# Patient Record
Sex: Male | Born: 1995 | Marital: Single
Health system: Southern US, Community
[De-identification: ages and names within clinical notes are randomized; demographics above are authoritative.]

## PROBLEM LIST (undated history)

## (undated) HISTORY — PX: TONSILLECTOMY: SUR1361

---
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
## 2020-04-23 ENCOUNTER — Ambulatory Visit
Admission: EM | Admit: 2020-04-23 | Discharge: 2020-04-23 | Disposition: A | Discharge status: Home / Self Care | Payer: Self-pay | Attending: Emergency Medicine | Admitting: Emergency Medicine

## 2020-04-23 ENCOUNTER — Other Ambulatory Visit: Payer: Self-pay

## 2020-04-23 ENCOUNTER — Encounter: Payer: Self-pay | Admitting: Emergency Medicine

## 2020-04-23 DIAGNOSIS — K029 Dental caries, unspecified: Secondary | ICD-10-CM

## 2020-04-23 DIAGNOSIS — K047 Periapical abscess without sinus: Secondary | ICD-10-CM

## 2020-04-23 MED ORDER — CHLORHEXIDINE GLUCONATE 0.12 % MT SOLN: OROMUCOSAL | 0 refills | Status: DC

## 2020-04-23 MED ORDER — IBUPROFEN 600 MG PO TABS: 600.0000 mg | ORAL_TABLET | Freq: Four times a day (QID) | ORAL | 0 refills | Status: DC | PRN

## 2020-04-23 MED ORDER — AMOXICILLIN-POT CLAVULANATE 875-125 MG PO TABS: 1.0000 | ORAL_TABLET | Freq: Two times a day (BID) | ORAL | 0 refills | Status: AC

## 2020-04-23 NOTE — Discharge Instructions (Signed)
Frequent salt water rinses, Peridex or Listerine after eating, ibuprofen 600 mg combined with 1000 mg of Tylenol 3-4 times together as needed for pain.  Finish the Augmentin, even if you feel better. OPTIONS FOR DENTAL FOLLOW UP CARE  [place] [facility] - Local Safety Net Dental Clinics TripDoors.com.htm   [facility] [phone])  [facility] [phone])  [place] [phone])  [facility] [phone])  [facility] [phone]) This clinic caters to the indigent population and is on a lottery system. Location: [employer] [facility], [employer], [address], [place] Clinic Hours: Wednesdays from 6pm - 9pm, patients seen by a lottery system. For dates, call or go to [email or web address] Services: Cleanings, fillings and simple extractions. Payment Options: DENTAL WORK IS FREE OF CHARGE. Bring proof of income or support. Best way to get seen: Arrive at 5:15 pm - this is a lottery, NOT first come/first serve, so arriving earlier will not increase your chances of being seen.     [facility] [phone] Select option 1 for emergencies   Location: [facility], [place], [address], [place] Clinic Hours: No walk-ins accepted - call the day before to schedule an appointment. Check in times are 9:30 am and 1:30 pm. Services: Simple extractions, temporary fillings, pulpectomy/pulp debridement, uncomplicated abscess drainage. Payment Options: PAYMENT IS DUE AT THE TIME OF SERVICE.  Fee is usually $100-200, additional surgical procedures (e.g. abscess drainage) may be extra. Cash, checks, Visa/MasterCard accepted.  Can file Medicaid if patient is covered for dental - patient should call case worker to check. No discount for [facility]  patients. Best way to get seen: MUST call the day before and get onto the schedule. Can usually be seen the next 1-2 days. No walk-ins accepted.     [facility] [phone]   Location: [facility], [address] [place] Clinic Hours: M, W, Th, F 8am or 1:30pm, Tues 9a or 1:30 - first come/first served. Services: Simple extractions, temporary fillings, uncomplicated abscess drainage.  You do not need to be an [facility] resident. Payment Options: PAYMENT IS DUE AT THE TIME OF SERVICE. Dental insurance, otherwise sliding scale - bring proof of income or support. Depending on income and treatment needed, cost is usually $50-200. Best way to get seen: Arrive early as it is first come/first served.     [facility] [phone]   Location: [address] Clinic Hours: Mon-Thu 8a-5p Services: Most basic dental services including extractions and fillings. Payment Options: PAYMENT IS DUE AT THE TIME OF SERVICE. Sliding scale, up to 50% off - bring proof if income or support. Medicaid with dental option accepted. Best way to get seen: Call to schedule an appointment, can usually be seen within 2 weeks OR they will try to see walk-ins - show up at 8a or 2p (you may have to wait).     [facility] [phone] [place] RESIDENTS ONLY   Location: [facility], [address], [place], [place] [address] Clinic Hours: By appointment only. Monday - Thursday 8am-5pm, Friday 8am-12pm Services: Cleanings, fillings, extractions. Payment Options: PAYMENT IS DUE AT THE TIME OF SERVICE. Cash, Visa or MasterCard. Sliding scale - $30 minimum per service. Best way to get seen: Come in to office, complete packet and make an appointment - need proof of income or support monies  for each household member and proof of [facility] residence. Usually takes about a month to  get in.     [facility] [phone]   Location: [address] [facility] Clinic Hours: Walk-in Urgent Care Dental Services are offered Monday-Friday mornings only. The numbers of emergencies accepted daily is limited to the number of providers available. Maximum 15 - Mondays, Wednesdays & Thursdays Maximum 10 - Tuesdays & Fridays Services: You do not need to be a [facility] resident to be seen for a dental emergency. Emergencies are defined as pain, swelling, abnormal bleeding, or dental trauma. Walkins will receive x-rays if needed. NOTE: Dental cleaning is not an emergency. Payment Options: PAYMENT IS DUE AT THE TIME OF SERVICE. Minimum co-pay is $40.00 for uninsured patients. Minimum co-pay is $3.00 for Medicaid with dental coverage. Dental Insurance is accepted and must be presented at time of visit. Medicare does not cover dental. Forms of payment: Cash, credit card, checks. Best way to get seen: If not previously registered with the clinic, walk-in dental registration begins at 7:15 am and is on a first come/first serve basis. If previously registered with the clinic, call to make an appointment.     [facility] [phone] [place] RESIDENTS ONLY   Location: [address], [place], [place] Clinic Hours: Mon-Thu 10a-2p Services: Extractions only! Payment Options: FREE (donations accepted) - bring proof of income or support Best way to get seen: Call and schedule an appointment OR come at 8am on the 1st Monday of every month (except for holidays) when it is first come/first served.     [facility] [phone]   Location: [address] [place], [place] Clinic Hours: Friday mornings Services, Payment Options, Best way to get seen: Call for info Here is a list of primary care providers who are taking new patients:  Dr. Elizabeth Sauer [address] [place] [address] [phone]  [facility] Primary  Care at [facility] [address] [place], [place] [address] [phone]  [facility] [place] [address]  [place] [address]  [phone]  [facility] [address] [place], [place] [address] [phone]  [facility] [address] [place]  [phone] [place], [place] [address]  Here are clinics/ other resources who will see you if you do not have insurance. Some have certain criteria that you must meet. Call them and find out what they are:  [facility]: [address], [place], [place] [address] Phone: [phone] Hours: First and Third Saturdays of each Month, 9 a.m. - 1 p.m.  [facility]: [address] [place], [place] [address] Phone: [phone] Hours: Tuesday, 4 p.m. - 8 p.m. Thursday, 1 p.m. - 8 p.m. Wednesday, 9 a.m. - [facility] [address], [place], [place] [address] Phone: [phone] Pharmacy Phone Number: [phone] Dental Phone Number: [phone] [facility] Insurance Help: [phone]  Dental Hours: Monday - Thursday, 8 a.m. - 6 p.m.  [facility] [address], [place], [place] [address] Phone: [phone] Pharmacy Phone Number: [phone] [facility] Insurance Help: [phone]  [facility] [address] [place] [place], [place] [address] Phone: [phone] Pharmacy Phone Number: [phone] [facility] Insurance Help: [phone]  [facility] [address] [place], [place] [address] Phone: [phone] [facility] Insurance Help: [phone]   [facility] [address], [place], [place] [address] Phone: [phone]  Go to www.goodrx.com to look up  your medications. This will give you a list of where you can find your prescriptions at the most affordable prices. Or ask the pharmacist what the cash price is, or if they have any other discount programs available to help make your medication more affordable. This  can be less expensive than what you would pay with insurance.

## 2020-04-23 NOTE — ED Triage Notes (Signed)
Patient c/o right sided upper back tooth and gum pain for the past 2 months.  Patient denies fevers.

## 2020-04-23 NOTE — ED Provider Notes (Signed)
HPI  SUBJECTIVE:  Daniel Phillips is a 24 y.o. male who presents with 2 months of posterior right upper tooth ache.  He states that his gums are irritated, bleeding.  He states that the pain has gotten worse over the past several weeks.  He states that he has a right-sided headache with radiation of this pain into his ear.  He describes the pain as throbbing, sore, constant.  He reports sensitivity to temperature and air.  No fevers, trismus, facial swelling, dental trauma.  States that he has not seen a dentist in a "long time".  He has tried Orajel which no longer works, occasional 200 mg of ibuprofen and an unknown pain pill from his mother.  The ibuprofen, pain pill helps.  Symptoms are worse at night, with eating and drinking.  He is a smoker.  No history of diabetes.  PMD: None.  Dentist: None.    History reviewed. No pertinent past medical history.  Past Surgical History:  Procedure Laterality Date  . TONSILLECTOMY      Family History  Problem Relation Age of Onset  . Heart disease Mother   . Heart attack Father     Social History   Tobacco Use  . Smoking status: Current Every Day Smoker    Types: Cigarettes  . Smokeless tobacco: Never Used  Vaping Use  . Vaping Use: Former  Substance Use Topics  . Alcohol use: Not Currently  . Drug use: Not Currently    Types: Marijuana    No current facility-administered medications for this encounter.  Current Outpatient Medications:  .  amoxicillin-clavulanate (AUGMENTIN) 875-125 MG tablet, Take 1 tablet by mouth 2 (two) times daily for 7 days., Disp: 14 tablet, Rfl: 0 .  chlorhexidine (PERIDEX) 0.12 % solution, 15 mL swish and spit bid, Disp: 480 mL, Rfl: 0 .  ibuprofen (ADVIL) 600 MG tablet, Take 1 tablet (600 mg total) by mouth every 6 (six) hours as needed., Disp: 30 tablet, Rfl: 0  No Known Allergies   ROS  As noted in HPI.   Physical Exam  BP 106/82 (BP Location: Right Arm)   Pulse (!) 58   Temp 98.6 F (37 C)  (Oral)   Resp 16   Ht 5\' 9"  (1.753 m)   Wt 71.7 kg   SpO2 100%   BMI 23.33 kg/m   Constitutional: Well developed, well nourished, no acute distress Eyes:  EOMI, conjunctiva normal bilaterally HENT: Normocephalic, atraumatic,mucus membranes moist Tooth #1 posterior right upper molar decayed to the gum.  Positive tender, swollen gum.  No expressible purulent drainage. Tooth #22, posterior right lower molar extensively decayed.  Positive gingival tenderness.  No gingival swelling. No Expressible purulent drainage. No facial swelling, trismus.  No swelling under the tongue or jaw. Neck: No cervical lymphadenopathy Respiratory: Normal inspiratory effort Cardiovascular: Normal rate GI: nondistended skin: No rash, skin intact Musculoskeletal: no deformities Neurologic: Alert & oriented x 3, no focal neuro deficits Psychiatric: Speech and behavior appropriate   ED Course   Medications - No data to display  No orders of the defined types were placed in this encounter.   No results found for this or any previous visit (from the past 24 hour(s)). No results found.  ED Clinical Impression  1. Dental caries   2. Dental infection      ED Assessment/Plan  Patient with dental decay, and dental infection.  Salt water rinses, Peridex or Listerine, ibuprofen 600 mg combined with 1000 mg of Tylenol 3-4  times together as needed for pain.  Augmentin, dental list, primary care list.  Meds ordered this encounter  Medications  . amoxicillin-clavulanate (AUGMENTIN) 875-125 MG tablet    Sig: Take 1 tablet by mouth 2 (two) times daily for 7 days.    Dispense:  14 tablet    Refill:  0  . ibuprofen (ADVIL) 600 MG tablet    Sig: Take 1 tablet (600 mg total) by mouth every 6 (six) hours as needed.    Dispense:  30 tablet    Refill:  0  . chlorhexidine (PERIDEX) 0.12 % solution    Sig: 15 mL swish and spit bid    Dispense:  480 mL    Refill:  0    *This clinic note was created using  Scientist, clinical (histocompatibility and immunogenetics). Therefore, there may be occasional mistakes despite careful proofreading.   ?    Domenick Gong, MD 04/24/20 [phone]

## 2020-07-13 ENCOUNTER — Ambulatory Visit
Admission: EM | Admit: 2020-07-13 | Discharge: 2020-07-13 | Disposition: A | Discharge status: Home / Self Care | Payer: Medicaid Other | Attending: Internal Medicine | Admitting: Internal Medicine

## 2020-07-13 ENCOUNTER — Encounter: Payer: Self-pay | Admitting: Emergency Medicine

## 2020-07-13 ENCOUNTER — Other Ambulatory Visit: Payer: Self-pay

## 2020-07-13 DIAGNOSIS — K0889 Other specified disorders of teeth and supporting structures: Secondary | ICD-10-CM

## 2020-07-13 MED ORDER — IBUPROFEN 800 MG PO TABS: 800.0000 mg | ORAL_TABLET | Freq: Three times a day (TID) | ORAL | 0 refills | Status: AC | PRN

## 2020-07-13 MED ORDER — CHLORHEXIDINE GLUCONATE 0.12 % MT SOLN: OROMUCOSAL | 0 refills | Status: AC

## 2020-07-13 MED ORDER — PENICILLIN V POTASSIUM 500 MG PO TABS: 500.0000 mg | ORAL_TABLET | Freq: Four times a day (QID) | ORAL | 0 refills | Status: AC

## 2020-07-13 NOTE — ED Provider Notes (Signed)
[facility]    CSN: [id number] Arrival date & time: 07/13/20  0807      History   Chief Complaint Chief Complaint  Patient presents with  . Dental Pain    HPI Daniel Phillips is a 24 y.o. male who presents with L lower molar pain off and on x 1 month, but got worse 2 days ago. Has never seen a dentist since last episode in September this year. Denies fever, chills or sweats or swollen glands.     History reviewed. No pertinent past medical history.  There are no problems to display for this patient.   Past Surgical History:  Procedure Laterality Date  . TONSILLECTOMY         Home Medications    Prior to Admission medications   Medication Sig Start Date End Date Taking? Authorizing Provider  chlorhexidine (PERIDEX) 0.12 % solution 15 mL swish and spit bid 04/23/20   Domenick Gong, MD  ibuprofen (ADVIL) 600 MG tablet Take 1 tablet (600 mg total) by mouth every 6 (six) hours as needed. 04/23/20   Domenick Gong, MD    Family History Family History  Problem Relation Age of Onset  . Heart disease Mother   . Heart attack Father     Social History Social History   Tobacco Use  . Smoking status: Former Smoker    Types: Cigarettes    Quit date: 06/13/2020    Years since quitting: 0.0  . Smokeless tobacco: Never Used  Vaping Use  . Vaping Use: Former  Substance Use Topics  . Alcohol use: Not Currently  . Drug use: Not Currently    Types: Marijuana     Allergies   Patient has no known allergies.   Review of Systems Review of Systems + for L lower molar pain, negative for fever, chills, swollen glands, cough, rhinitis, sore thriat  Physical Exam Triage Vital Signs ED Triage Vitals  Enc Vitals Group     BP 07/13/20 0816 118/71     Pulse Rate 07/13/20 0816 60     Resp 07/13/20 0816 18     Temp 07/13/20 0816 98.3 F (36.8 C)     Temp Source 07/13/20 0816 Oral     SpO2 07/13/20 0816 100 %     Weight 07/13/20 0814 158 lb 1.1 oz (71.7  kg)     Height 07/13/20 0814 5\' 9"  (1.753 m)     Head Circumference --      Peak Flow --      Pain Score 07/13/20 0814 8     Pain Loc --      Pain Edu? --      Excl. in GC? --    No data found.  Updated Vital Signs BP 118/71 (BP Location: Left Arm)   Pulse 60   Temp 98.3 F (36.8 C) (Oral)   Resp 18   Ht 5\' 9"  (1.753 m)   Wt 158 lb 1.1 oz (71.7 kg)   SpO2 100%   BMI 23.34 kg/m   Visual Acuity Right Eye Distance:   Left Eye Distance:   Bilateral Distance:    Right Eye Near:   Left Eye Near:    Bilateral Near:     Physical Exam Vitals and nursing note reviewed.  Constitutional:      General: He is not in acute distress.    Appearance: He is normal weight. He is not toxic-appearing.  HENT:     Head: Normocephalic.  Right Ear: External ear normal.     Left Ear: External ear normal.     Mouth/Throat:     Mouth: Mucous membranes are moist.     Comments: Has poor dentition, has cracked L lower last molar with mild redness of his gum. No abscess noted.  Eyes:     General: No scleral icterus.    Conjunctiva/sclera: Conjunctivae normal.  Neck:     Comments: Small shotty nodes noted on L anterior chain Pulmonary:     Effort: Pulmonary effort is normal.  Musculoskeletal:        General: Normal range of motion.     Cervical back: Neck supple.  Lymphadenopathy:     Cervical: Cervical adenopathy present.  Skin:    General: Skin is warm and dry.     Findings: No rash.  Neurological:     Mental Status: He is alert and oriented to person, place, and time.     Gait: Gait normal.  Psychiatric:        Mood and Affect: Mood normal.        Behavior: Behavior normal.        Thought Content: Thought content normal.        Judgment: Judgment normal.    UC Treatments / Results  Labs (all labs ordered are listed, but only abnormal results are displayed) Labs Reviewed - No data to display  EKG   Radiology No results found.  Procedures Procedures (including  critical care time)  Medications Ordered in UC Medications - No data to display  Initial Impression / Assessment and Plan / UC Course  I have reviewed the triage vital signs and the nursing notes. Carious teeth with cracked molar which is causing increased pain and may have a secondary infection. I placed him on [facility] as noted and refilled the mouth rinse  he was given  Last time which he states helped him a lot. I also placed him on Ibuprofen 800 mg tid prn pain.   Final Clinical Impressions(s) / UC Diagnoses   Final diagnoses:  None   Discharge Instructions   None    ED Prescriptions    None     PDMP not reviewed this encounter.   Garey Ham, PA-C 07/13/20 1019

## 2020-07-13 NOTE — ED Triage Notes (Signed)
Pt c/o lower left sided tooth pain. Started about a month ago but has gotten worse over the last couple of days. He states he has swelling in the gun area.

## 2020-07-13 NOTE — Discharge Instructions (Signed)
You may add Tylenol 1000 mg between the Ibuprofen doses for pain.

## 2022-04-25 ENCOUNTER — Ambulatory Visit
Admission: EM | Admit: 2022-04-25 | Discharge: 2022-04-25 | Disposition: A | Discharge status: Home / Self Care | Payer: 59 | Attending: Family Medicine | Admitting: Family Medicine

## 2022-04-25 ENCOUNTER — Ambulatory Visit (INDEPENDENT_AMBULATORY_CARE_PROVIDER_SITE_OTHER): Payer: Self-pay

## 2022-04-25 DIAGNOSIS — R319 Hematuria, unspecified: Secondary | ICD-10-CM

## 2022-04-25 DIAGNOSIS — U071 COVID-19: Secondary | ICD-10-CM

## 2022-04-25 DIAGNOSIS — M545 Low back pain, unspecified: Secondary | ICD-10-CM

## 2022-04-25 DIAGNOSIS — R509 Fever, unspecified: Secondary | ICD-10-CM

## 2022-04-25 DIAGNOSIS — R109 Unspecified abdominal pain: Secondary | ICD-10-CM

## 2022-04-25 LAB — CBC WITH DIFFERENTIAL/PLATELET
Abs Immature Granulocytes: 0.03 10*3/uL (ref 0.00–0.07)
Basophils Absolute: 0 10*3/uL (ref 0.0–0.1)
Basophils Relative: 0 %
Eosinophils Absolute: 0 10*3/uL (ref 0.0–0.5)
Eosinophils Relative: 0 %
HCT: 45.1 % (ref 39.0–52.0)
Hemoglobin: 15.3 g/dL (ref 13.0–17.0)
Immature Granulocytes: 0 %
Lymphocytes Relative: 20 %
Lymphs Abs: 2.1 10*3/uL (ref 0.7–4.0)
MCH: 31.4 pg (ref 26.0–34.0)
MCHC: 33.9 g/dL (ref 30.0–36.0)
MCV: 92.4 fL (ref 80.0–100.0)
Monocytes Absolute: 1.5 10*3/uL — ABNORMAL HIGH (ref 0.1–1.0)
Monocytes Relative: 14 %
Neutro Abs: 6.8 10*3/uL (ref 1.7–7.7)
Neutrophils Relative %: 66 %
Platelets: 203 10*3/uL (ref 150–400)
RBC: 4.88 MIL/uL (ref 4.22–5.81)
RDW: 12.2 % (ref 11.5–15.5)
WBC: 10.5 10*3/uL (ref 4.0–10.5)
nRBC: 0 % (ref 0.0–0.2)

## 2022-04-25 LAB — URINALYSIS, ROUTINE W REFLEX MICROSCOPIC
Bilirubin Urine: NEGATIVE
Glucose, UA: NEGATIVE mg/dL
Ketones, ur: NEGATIVE mg/dL
Leukocytes,Ua: NEGATIVE
Nitrite: NEGATIVE
Protein, ur: NEGATIVE mg/dL
Specific Gravity, Urine: 1.015 (ref 1.005–1.030)
pH: 7 (ref 5.0–8.0)

## 2022-04-25 LAB — COMPREHENSIVE METABOLIC PANEL
ALT: 23 U/L (ref 0–44)
AST: 22 U/L (ref 15–41)
Albumin: 4.5 g/dL (ref 3.5–5.0)
Alkaline Phosphatase: 58 U/L (ref 38–126)
Anion gap: 8 (ref 5–15)
BUN: 11 mg/dL (ref 6–20)
CO2: 29 mmol/L (ref 22–32)
Calcium: 9.3 mg/dL (ref 8.9–10.3)
Chloride: 102 mmol/L (ref 98–111)
Creatinine, Ser: 1 mg/dL (ref 0.61–1.24)
GFR, Estimated: >60 mL/min (ref >60)
Glucose, Bld: 95 mg/dL (ref 70–99)
Potassium: 4.3 mmol/L (ref 3.5–5.1)
Sodium: 139 mmol/L (ref 135–145)
Total Bilirubin: 0.4 mg/dL (ref 0.3–1.2)
Total Protein: 7.8 g/dL (ref 6.5–8.1)

## 2022-04-25 LAB — URINALYSIS, MICROSCOPIC (REFLEX)

## 2022-04-25 LAB — SARS CORONAVIRUS 2 BY RT PCR: SARS Coronavirus 2 by RT PCR: POSITIVE — AB

## 2022-04-25 MED ORDER — CYCLOBENZAPRINE HCL 7.5 MG PO TABS: 7.5000 mg | ORAL_TABLET | Freq: Two times a day (BID) | ORAL | 0 refills | Status: AC | PRN

## 2022-04-25 MED ORDER — NAPROXEN 500 MG PO TABS: 500.0000 mg | ORAL_TABLET | Freq: Two times a day (BID) | ORAL | 0 refills | Status: AC

## 2022-04-25 MED ORDER — PROMETHAZINE HCL 25 MG/ML IJ SOLN
25.0000 mg | Freq: Once | INTRAMUSCULAR | Status: AC
  Administered 2022-04-25: 25 mg via INTRAMUSCULAR

## 2022-04-25 MED ORDER — KETOROLAC TROMETHAMINE 60 MG/2ML IM SOLN
30.0000 mg | Freq: Once | INTRAMUSCULAR | Status: AC
  Administered 2022-04-25: 30 mg via INTRAMUSCULAR

## 2022-04-25 NOTE — Discharge Instructions (Addendum)
Your test for COVID-19 was positive, meaning that you were infected with the novel coronavirus and could give the germ to others.  Please continue isolation at home for at least 5 days since the start of your symptoms. Once you complete your 5 day quarantine, you may return to normal activities as long as you've not had a fever for over 24 hours(without taking fever reducing medicine) and your symptoms are improving. Be sure to wear a mask until Day 11.   Please continue good preventive care measures, including:  frequent hand-washing, avoid touching your face, cover coughs/sneezes, stay out of crowds and keep a 6 foot distance from others.  Go to the nearest hospital emergency room if fever/cough/breathlessness are severe or illness seems like a threat to life.  

## 2022-04-25 NOTE — ED Provider Notes (Signed)
[facility]    CSN: [id number] Arrival date & time: 04/25/22  1038      History   Chief Complaint Chief Complaint  Patient presents with   Fever   Back Pain   Emesis    HPI Daniel Phillips is a 26 y.o. male.   HPI   Daniel Phillips presents for myalgias and fatigue that started 2 days ago. He felt hot. Last night he started throwing. He had 2 episodes of vomiting. He continues of have waves of nausea.  Denies abdominal pain.  Has not been coughing.  Has persistent nasal congestion and runny nose.  Says his brother told him he had blood in his urine.  He has bilateral lower back pain. No history of kidney stones. He took Ibuprofen which helped his headache but not his back pain. No blood in urine. No dysuria and urinary urgency.  Has some urinary frequency.  There is been no documented fever but he has felt hot and had chills.   Fever : undocumented Chills: yes Sore throat: no Cough: no Sputum: no Nasal congestion : yes Rhinorrhea: yes Myalgias: yes Appetite: normal  Hydration: normal  Abdominal pain: no Nausea: yes Vomiting: yes Diarrhea: No Rash: No Sleep disturbance: yes Headache: yes     No past medical history on file.  There are no problems to display for this patient.   Past Surgical History:  Procedure Laterality Date   TONSILLECTOMY         Home Medications    Prior to Admission medications   Medication Sig Start Date End Date Taking? Authorizing Provider  cyclobenzaprine (FEXMID) 7.5 MG tablet Take 1 tablet (7.5 mg total) by mouth 2 (two) times daily as needed for muscle spasms. 04/25/22  Yes Jeshurun Oaxaca, DO  naproxen (NAPROSYN) 500 MG tablet Take 1 tablet (500 mg total) by mouth 2 (two) times daily with a meal. 04/25/22  Yes Laine Giovanetti, DO  chlorhexidine (PERIDEX) 0.12 % solution 15 mL swish and spit bid 07/13/20   Rodriguez-Southworth, Nettie Elm, PA-C  ibuprofen (ADVIL) 800 MG tablet Take 1 tablet (800 mg total) by mouth every 8  (eight) hours as needed. 07/13/20   Rodriguez-Southworth, Nettie Elm, PA-C    Family History Family History  Problem Relation Age of Onset   Heart disease Mother    Heart attack Father     Social History Social History   Tobacco Use   Smoking status: Former    Types: Cigarettes    Quit date: 06/13/2020    Years since quitting: 1.8   Smokeless tobacco: Never  Vaping Use   Vaping Use: Former  Substance Use Topics   Alcohol use: Not Currently   Drug use: Not Currently    Types: Marijuana     Allergies   Patient has no known allergies.   Review of Systems Review of Systems: negative unless otherwise stated in HPI.      Physical Exam Triage Vital Signs ED Triage Vitals  Enc Vitals Group     BP 04/25/22 1122 114/87     Pulse Rate 04/25/22 1122 66     Resp --      Temp 04/25/22 1122 99.1 F (37.3 C)     Temp Source 04/25/22 1122 Oral     SpO2 04/25/22 1122 98 %     Weight 04/25/22 1121 180 lb (81.6 kg)     Height 04/25/22 1121 5\' 10"  (1.778 m)     Head Circumference --  Peak Flow --      Pain Score 04/25/22 1121 7     Pain Loc --      Pain Edu? --      Excl. in GC? --    No data found.  Updated Vital Signs BP 114/87 (BP Location: Left Arm)   Pulse 66   Temp 99.1 F (37.3 C) (Oral)   Ht 5\' 10"  (1.778 m)   Wt 81.6 kg   SpO2 98%   BMI 25.83 kg/m   Visual Acuity Right Eye Distance:   Left Eye Distance:   Bilateral Distance:    Right Eye Near:   Left Eye Near:    Bilateral Near:     Physical Exam GEN:     alert, non-toxic appearing male in no distress    HENT:  mucus membranes moist, oropharyngeal without lesions or exudate, no tonsillar hypertrophy,  mild oropharyngeal erythema ,  moderate erythematous hypertrophied turbinates, clear nasal discharge, bilateral TM normal EYES:   pupils equal and reactive, EOMi, no scleral injection NECK:  normal ROM RESP:  no increased work of breathing, clear to auscultation bilaterally CVS:   regular rate and  rhythm ABD:   soft, non-tender, no CVA tenderness  MSK: Lumbar spine: - Inspection: no gross deformity or asymmetry, swelling or ecchymosis. No skin changes - Palpation: No TTP over the spinous processes, paraspinal muscles, or SI joints b/l - ROM: full active ROM of the lumbar spine in flexion and extension without pain - Strength: 5/5 strength of lower extremity in L4-S1 nerve root distributions b/l - Neuro: sensation intact in the L4-S1 nerve root distribution b/l Skin:   warm and dry, no rash on visible skin    UC Treatments / Results  Labs (all labs ordered are listed, but only abnormal results are displayed) Labs Reviewed  SARS CORONAVIRUS 2 BY RT PCR - Abnormal; Notable for the following components:      Result Value   SARS Coronavirus 2 by RT PCR POSITIVE (*)    All other components within normal limits  URINALYSIS, ROUTINE W REFLEX MICROSCOPIC - Abnormal; Notable for the following components:   Hgb urine dipstick SMALL (*)    All other components within normal limits  CBC WITH DIFFERENTIAL/PLATELET - Abnormal; Notable for the following components:   Monocytes Absolute 1.5 (*)    All other components within normal limits  URINALYSIS, MICROSCOPIC (REFLEX) - Abnormal; Notable for the following components:   Bacteria, UA FEW (*)    All other components within normal limits  COMPREHENSIVE METABOLIC PANEL    EKG   Radiology CT Renal Stone Study  Result Date: 04/25/2022 CLINICAL DATA:  Flank pain with fever and back pain x2 days. EXAM: CT ABDOMEN AND PELVIS WITHOUT CONTRAST TECHNIQUE: Multidetector CT imaging of the abdomen and pelvis was performed following the standard protocol without IV contrast. RADIATION DOSE REDUCTION: This exam was performed according to the departmental dose-optimization program which includes automated exposure control, adjustment of the mA and/or kV according to patient size and/or use of iterative reconstruction technique. COMPARISON:  None  Available. FINDINGS: Lower chest: No acute abnormality. Hepatobiliary: Unremarkable noncontrast enhanced appearance of the liver and gallbladder. No biliary ductal dilation. Pancreas: No pancreatic ductal dilation or evidence of acute inflammation. Spleen: No splenomegaly. Adrenals/Urinary Tract: Bilateral adrenal glands appear normal. No hydronephrosis. No renal, ureteral or bladder calculi identified. Urinary bladder is normal for degree of distension. Stomach/Bowel: No radiopaque enteric contrast material was administered. No pathologic dilation of small or  large bowel. The appendix is not confidently identified however there is no pericecal inflammation or other evidence to suggest acute appendicitis. Terminal ileum appears normal. No evidence of acute bowel inflammation. Vascular/Lymphatic: Normal caliber abdominal aorta. No pathologically enlarged abdominal or pelvic lymph nodes. Reproductive: Prostate is unremarkable. Other: No significant abdominopelvic free fluid. Musculoskeletal: No acute or significant osseous findings. IMPRESSION: No acute abnormality identified in the abdomen or pelvis. Specifically no evidence of nephrolithiasis or obstructive uropathy. Electronically Signed   By: Maudry Mayhew M.D.   On: 04/25/2022 12:52    Procedures Procedures (including critical care time)  Medications Ordered in UC Medications  ketorolac (TORADOL) injection 30 mg (30 mg Intramuscular Given 04/25/22 1231)  promethazine (PHENERGAN) injection 25 mg (25 mg Intramuscular Given 04/25/22 1231)    Initial Impression / Assessment and Plan / UC Course  I have reviewed the triage vital signs and the nursing notes.  Pertinent labs & imaging results that were available during my care of the patient were reviewed by me and considered in my medical decision making (see chart for details).       Pt is a 26 y.o. male who presents for 2 days of respiratory symptoms. Keymani has an elevated temperature here without  recent antipyretics.  He satting well on room air.  Overall, patient is well-hydrated and without distress.  Pulmonary exam unremarkable.  Chest imaging deferred.  COVID test obtained and was positive.  Discussed lack of efficacy of antibiotics in viral disease.  Reviewed symptomatic treatment.  He has had 2 days of bilateral lower back pain.  Lumbar exam unremarkable.  UA with hematuria that was supported on microscopy.  There was some bacteria but overall not concerning for acute cystitis.  Patient had concern for STI.  There is no leukocytosis on CBC.  CMP unremarkable.  Given the hematuria and back pain CT renal was obtained.  There were no osseous findings for the cause of patient's back pain and there was no kidney stones or obstructive uropathy.   Discussed return and ED precautions, understanding voiced. MDM, treatment plan and plan for follow-up reviewed with patient who agrees with plan.     Final Clinical Impressions(s) / UC Diagnoses   Final diagnoses:  COVID-19  Hematuria, unspecified type  Acute bilateral low back pain without sciatica     Discharge Instructions      Your test for COVID-19 was positive, meaning that you were infected with the novel coronavirus and could give the germ to others.  Please continue isolation at home for at least 5 days since the start of your symptoms. Once you complete your 5 day quarantine, you may return to normal activities as long as you've not had a fever for over 24 hours(without taking fever reducing medicine) and your symptoms are improving. Be sure to wear a mask until Day 11.   Please continue good preventive care measures, including:  frequent hand-washing, avoid touching your face, cover coughs/sneezes, stay out of crowds and keep a 6 foot distance from others.  Go to the nearest hospital emergency room if fever/cough/breathlessness are severe or illness seems like a threat to life.      ED Prescriptions     Medication Sig  Dispense Auth. Provider   naproxen (NAPROSYN) 500 MG tablet Take 1 tablet (500 mg total) by mouth 2 (two) times daily with a meal. 30 tablet Shari Natt, DO   cyclobenzaprine (FEXMID) 7.5 MG tablet Take 1 tablet (7.5 mg total) by mouth 2 (two)  times daily as needed for muscle spasms. 30 tablet Katha Cabal, DO      PDMP not reviewed this encounter.   Katha Cabal, DO 04/25/22 2018

## 2022-04-25 NOTE — ED Triage Notes (Signed)
Pt c/o  fever x2 days ago, lower back pain, pt states light sensitive to eyes, denies any urinary symptoms, no hx of kidney stones, some runny nose. Pt also reports x2 episodes of emesis yesterday
